# Patient Record
Sex: Female | Born: 1995 | Hispanic: No | Marital: Single | Smoking: Former smoker
Health system: Southern US, Community
[De-identification: ages and names within clinical notes are randomized; demographics above are authoritative.]

---

## 2016-08-05 ENCOUNTER — Emergency Department
Admission: EM | Admit: 2016-08-05 | Discharge: 2016-08-05 | Disposition: A | Discharge status: Home / Self Care | Payer: Self-pay | Attending: Emergency Medicine | Admitting: Emergency Medicine

## 2016-08-05 ENCOUNTER — Encounter: Payer: Self-pay | Admitting: Emergency Medicine

## 2016-08-05 DIAGNOSIS — N939 Abnormal uterine and vaginal bleeding, unspecified: Secondary | ICD-10-CM | POA: Insufficient documentation

## 2016-08-05 DIAGNOSIS — Z5321 Procedure and treatment not carried out due to patient leaving prior to being seen by health care provider: Secondary | ICD-10-CM | POA: Insufficient documentation

## 2016-08-05 LAB — CBC WITH DIFFERENTIAL/PLATELET
Basophils Absolute: 0 10*3/uL (ref 0–0.1)
Basophils Relative: 0 %
Eosinophils Absolute: 0 10*3/uL (ref 0–0.7)
Eosinophils Relative: 0 %
HEMATOCRIT: 35.4 % (ref 35.0–47.0)
HEMOGLOBIN: 11.9 g/dL — AB (ref 12.0–16.0)
Lymphocytes Relative: 34 %
Lymphs Abs: 2.1 10*3/uL (ref 1.0–3.6)
MCH: 27.3 pg (ref 26.0–34.0)
MCHC: 33.5 g/dL (ref 32.0–36.0)
MCV: 81.5 fL (ref 80.0–100.0)
Monocytes Absolute: 0.4 10*3/uL (ref 0.2–0.9)
Monocytes Relative: 6 %
NEUTROS ABS: 3.8 10*3/uL (ref 1.4–6.5)
NEUTROS PCT: 60 %
Platelets: 314 10*3/uL (ref 150–440)
RBC: 4.34 MIL/uL (ref 3.80–5.20)
RDW: 14.5 % (ref 11.5–14.5)
WBC: 6.4 10*3/uL (ref 3.6–11.0)

## 2016-08-05 LAB — BASIC METABOLIC PANEL
Anion gap: 6 (ref 5–15)
BUN: 11 mg/dL (ref 6–20)
CHLORIDE: 107 mmol/L (ref 101–111)
CO2: 25 mmol/L (ref 22–32)
CREATININE: 0.66 mg/dL (ref 0.44–1.00)
Calcium: 9.4 mg/dL (ref 8.9–10.3)
GFR calc non Af Amer: >60 mL/min (ref >60)
Glucose, Bld: 95 mg/dL (ref 65–99)
POTASSIUM: 3.7 mmol/L (ref 3.5–5.1)
SODIUM: 138 mmol/L (ref 135–145)

## 2016-08-05 NOTE — ED Notes (Signed)
Called x 3 in lobby no answer.

## 2016-08-05 NOTE — ED Triage Notes (Signed)
Heavy vaginal bleeding x 1 year.  Also c/o feeling dizzy and faint with every period x 1 year.  Has been seen by OBGYN.

## 2017-12-09 ENCOUNTER — Emergency Department
Admission: EM | Admit: 2017-12-09 | Discharge: 2017-12-09 | Disposition: A | Discharge status: Home / Self Care | Payer: BLUE CROSS/BLUE SHIELD | Attending: Student in an Organized Health Care Education/Training Program | Admitting: Student in an Organized Health Care Education/Training Program

## 2017-12-09 ENCOUNTER — Encounter: Payer: Self-pay | Admitting: Emergency Medicine

## 2017-12-09 ENCOUNTER — Emergency Department: Payer: BLUE CROSS/BLUE SHIELD

## 2017-12-09 ENCOUNTER — Other Ambulatory Visit: Payer: Self-pay

## 2017-12-09 DIAGNOSIS — F430 Acute stress reaction: Secondary | ICD-10-CM | POA: Insufficient documentation

## 2017-12-09 DIAGNOSIS — F410 Panic disorder [episodic paroxysmal anxiety] without agoraphobia: Secondary | ICD-10-CM

## 2017-12-09 DIAGNOSIS — F419 Anxiety disorder, unspecified: Secondary | ICD-10-CM | POA: Diagnosis present

## 2017-12-09 LAB — URINALYSIS, COMPLETE (UACMP) WITH MICROSCOPIC
BILIRUBIN URINE: NEGATIVE
GLUCOSE, UA: NEGATIVE mg/dL
Hgb urine dipstick: NEGATIVE
Ketones, ur: NEGATIVE mg/dL
LEUKOCYTES UA: NEGATIVE
NITRITE: NEGATIVE
Protein, ur: NEGATIVE mg/dL
SPECIFIC GRAVITY, URINE: 1.002 — AB (ref 1.005–1.030)
WBC, UA: NONE SEEN WBC/hpf (ref 0–5)
pH: 7 (ref 5.0–8.0)

## 2017-12-09 LAB — TROPONIN I

## 2017-12-09 LAB — CBC
HCT: 38.9 % (ref 35.0–47.0)
Hemoglobin: 13.1 g/dL (ref 12.0–16.0)
MCH: 28.1 pg (ref 26.0–34.0)
MCHC: 33.5 g/dL (ref 32.0–36.0)
MCV: 83.8 fL (ref 80.0–100.0)
Platelets: 337 10*3/uL (ref 150–440)
RBC: 4.64 MIL/uL (ref 3.80–5.20)
RDW: 14.5 % (ref 11.5–14.5)
WBC: 8.9 10*3/uL (ref 3.6–11.0)

## 2017-12-09 LAB — POCT PREGNANCY, URINE: Preg Test, Ur: NEGATIVE

## 2017-12-09 LAB — FIBRIN DERIVATIVES D-DIMER (ARMC ONLY): FIBRIN DERIVATIVES D-DIMER (ARMC): 218.25 ng{FEU}/mL (ref 0.00–499.00)

## 2017-12-09 MED ORDER — LORAZEPAM 0.5 MG PO TABS: 0.5000 mg | ORAL_TABLET | Freq: Three times a day (TID) | ORAL | 0 refills | Status: AC | PRN

## 2017-12-09 MED ORDER — LORAZEPAM 1 MG PO TABS
1.0000 mg | ORAL_TABLET | Freq: Once | ORAL | Status: AC
  Administered 2017-12-09: 1 mg via ORAL
  Filled 2017-12-09: qty 1

## 2017-12-09 NOTE — ED Provider Notes (Signed)
[facility] Emergency Department Provider Note    First MD Initiated Contact with Patient 12/09/17 1833     (approximate)  I have reviewed the triage vital signs and the nursing notes.   HISTORY  Chief Complaint Dizziness and Chest Pain    HPI Kristy Mendoza is a 22 y.o. female presents the ER with chief complaint of anxiety stress and feeling that she is having a panic attack.  Does not have a formal diagnosis of anxiety but feels like she is been suffering from increased stress and panic for the past several days.  States that she was having chest pain yesterday and shortness of breath.  Has been feeling her heart racing and feels like her throat is closing up.  Does endorse feeling an overwhelming sense of doom.  Denies any substance use.  No SI or HI.  No recent medication changes.    History reviewed. No pertinent past medical history. No family history on file. History reviewed. No pertinent surgical history. There are no active problems to display for this patient.     Prior to Admission medications   Not on File    Allergies Penicillins    Social History Social History   Tobacco Use  . Smoking status: Never Smoker  . Smokeless tobacco: Never Used  Substance Use Topics  . Alcohol use: No  . Drug use: No    Review of Systems Patient denies headaches, rhinorrhea, blurry vision, numbness, shortness of breath, chest pain, edema, cough, abdominal pain, nausea, vomiting, diarrhea, dysuria, fevers, rashes or hallucinations unless otherwise stated above in HPI. ____________________________________________   PHYSICAL EXAM:  VITAL SIGNS: Vitals:   12/09/17 1756  BP: (!) 147/91  Pulse: (!) 120  Resp: 20  Temp: 98.3 F (36.8 C)  SpO2: 100%    Constitutional: Alert and oriented.  Eyes: Conjunctivae are normal.  Head: Atraumatic. Nose: No congestion/rhinnorhea. Mouth/Throat: Mucous membranes are moist.   Neck: No stridor.  Painless ROM.  Cardiovascular: Normal rate, regular rhythm. Grossly normal heart sounds.  Good peripheral circulation. Respiratory: Normal respiratory effort.  No retractions. Lungs CTAB. Gastrointestinal: Soft and nontender. No distention. No abdominal bruits. No CVA tenderness. Genitourinary:  Musculoskeletal: No lower extremity tenderness nor edema.  No joint effusions. Neurologic:  Normal speech and language. No gross focal neurologic deficits are appreciated. No facial droop Skin:  Skin is warm, dry and intact. No rash noted. Psychiatric: Mood and affect are normal. Speech and behavior are normal.  ____________________________________________   LABS (all labs ordered are listed, but only abnormal results are displayed)  Results for orders placed or performed during the hospital encounter of 12/09/17 (from the past 24 hour(s))  CBC     Status: None   Collection Time: 12/09/17  6:01 PM  Result Value Ref Range   WBC 8.9 3.6 - 11.0 K/uL   RBC 4.64 3.80 - 5.20 MIL/uL   Hemoglobin 13.1 12.0 - 16.0 g/dL   HCT 16.1 09.6 - 04.5 %   MCV 83.8 80.0 - 100.0 fL   MCH 28.1 26.0 - 34.0 pg   MCHC 33.5 32.0 - 36.0 g/dL   RDW 40.9 81.1 - 91.4 %   Platelets 337 150 - 440 K/uL  Troponin I     Status: None   Collection Time: 12/09/17  6:01 PM  Result Value Ref Range   Troponin I <0.03 <0.03 ng/mL  Urinalysis, Complete w Microscopic     Status: Abnormal   Collection Time: 12/09/17  6:02  PM  Result Value Ref Range   Color, Urine COLORLESS (A) YELLOW   APPearance CLEAR (A) CLEAR   Specific Gravity, Urine 1.002 (L) 1.005 - 1.030   pH 7.0 5.0 - 8.0   Glucose, UA NEGATIVE NEGATIVE mg/dL   Hgb urine dipstick NEGATIVE NEGATIVE   Bilirubin Urine NEGATIVE NEGATIVE   Ketones, ur NEGATIVE NEGATIVE mg/dL   Protein, ur NEGATIVE NEGATIVE mg/dL   Nitrite NEGATIVE NEGATIVE   Leukocytes, UA NEGATIVE NEGATIVE   RBC / HPF 0-5 0 - 5 RBC/hpf   WBC, UA NONE SEEN 0 - 5 WBC/hpf   Bacteria, UA RARE (A) NONE  SEEN   Squamous Epithelial / LPF 0-5 0 - 5  Pregnancy, urine POC     Status: None   Collection Time: 12/09/17  6:11 PM  Result Value Ref Range   Preg Test, Ur NEGATIVE NEGATIVE   ____________________________________________  EKG My review and personal interpretation at Time: 18:01   Indication: chest pain  Rate: 110  Rhythm: sinus Axis: normal Other: normal intervals, no wpw or brugada ____________________________________________  RADIOLOGY  I personally reviewed all radiographic images ordered to evaluate for the above acute complaints and reviewed radiology reports and findings.  These findings were personally discussed with the patient.  Please see medical record for radiology report.   ____________________________________________   PROCEDURES  Procedure(s) performed:  Procedures    Critical Care performed: no ____________________________________________   INITIAL IMPRESSION / ASSESSMENT AND PLAN / ED COURSE  Pertinent labs & imaging results that were available during my care of the patient were reviewed by me and considered in my medical decision making (see chart for details).   DDX: ACS, pericarditis, esophagitis, boerhaaves, pe, dissection, pna, bronchitis, costochondritis   Kristy Mendoza is a 22 y.o. who presents to the ED with symptoms as described above.  EKG shows no evidence of preexcitation syndrome.  Not clinically consistent with ACS or dissection.  Patient low risk by [employer] but is on birth control therefore d-dimer ordered to further risk stratify, which was negative.  Patient given oral anxiolytic with improvement in symptoms.  Most clinically consistent with anxiety and panic attack.  Patient stable and appropriate for outpatient follow-up.  Discussed signs and symptoms which should return immediately to the hospital.      As part of my medical decision making, I reviewed the following data within the electronic MEDICAL RECORD NUMBER Nursing notes  reviewed and incorporated, Labs reviewed, notes from prior ED visits.   ____________________________________________   FINAL CLINICAL IMPRESSION(S) / ED DIAGNOSES  Final diagnoses:  Panic attack as reaction to stress      NEW MEDICATIONS STARTED DURING THIS VISIT:  New Prescriptions   No medications on file     Note:  This document was prepared using Dragon voice recognition software and may include unintentional dictation errors.    Willy Eddyobinson, Anik Wesch, MD 12/09/17 2107

## 2017-12-09 NOTE — ED Triage Notes (Signed)
States chest pain, dizziness, nausea since yesterday. Has history of panic attacks so thought it was that but has not resolved. Nauseated without emesis.

## 2018-01-23 ENCOUNTER — Ambulatory Visit: Payer: Self-pay | Admitting: Physician Assistant

## 2018-03-13 ENCOUNTER — Emergency Department: Payer: BLUE CROSS/BLUE SHIELD

## 2018-03-13 ENCOUNTER — Other Ambulatory Visit: Payer: Self-pay

## 2018-03-13 ENCOUNTER — Emergency Department
Admission: EM | Admit: 2018-03-13 | Discharge: 2018-03-13 | Disposition: A | Discharge status: Home / Self Care | Payer: BLUE CROSS/BLUE SHIELD | Attending: Emergency Medicine | Admitting: Emergency Medicine

## 2018-03-13 DIAGNOSIS — R0789 Other chest pain: Secondary | ICD-10-CM | POA: Insufficient documentation

## 2018-03-13 DIAGNOSIS — K2900 Acute gastritis without bleeding: Secondary | ICD-10-CM | POA: Insufficient documentation

## 2018-03-13 DIAGNOSIS — R079 Chest pain, unspecified: Secondary | ICD-10-CM | POA: Diagnosis present

## 2018-03-13 LAB — TROPONIN I: Troponin I: <0.03 ng/mL (ref <0.03)

## 2018-03-13 LAB — CBC
HEMATOCRIT: 37.9 % (ref 35.0–47.0)
Hemoglobin: 12.7 g/dL (ref 12.0–16.0)
MCH: 27.5 pg (ref 26.0–34.0)
MCHC: 33.5 g/dL (ref 32.0–36.0)
MCV: 82 fL (ref 80.0–100.0)
PLATELETS: 333 10*3/uL (ref 150–440)
RBC: 4.62 MIL/uL (ref 3.80–5.20)
RDW: 15.7 % — ABNORMAL HIGH (ref 11.5–14.5)
WBC: 8.9 10*3/uL (ref 3.6–11.0)

## 2018-03-13 LAB — BASIC METABOLIC PANEL
Anion gap: 11 (ref 5–15)
BUN: 11 mg/dL (ref 6–20)
CHLORIDE: 105 mmol/L (ref 98–111)
CO2: 24 mmol/L (ref 22–32)
CREATININE: 0.74 mg/dL (ref 0.44–1.00)
Calcium: 9.7 mg/dL (ref 8.9–10.3)
GFR calc Af Amer: >60 mL/min (ref >60)
GFR calc non Af Amer: >60 mL/min (ref >60)
Glucose, Bld: 98 mg/dL (ref 70–99)
POTASSIUM: 3.8 mmol/L (ref 3.5–5.1)
Sodium: 140 mmol/L (ref 135–145)

## 2018-03-13 LAB — POCT PREGNANCY, URINE: Preg Test, Ur: NEGATIVE

## 2018-03-13 MED ORDER — SUCRALFATE 1 G PO TABS: 1.0000 g | ORAL_TABLET | Freq: Four times a day (QID) | ORAL | 1 refills | Status: AC

## 2018-03-13 MED ORDER — FAMOTIDINE 20 MG PO TABS: 20.0000 mg | ORAL_TABLET | Freq: Two times a day (BID) | ORAL | 0 refills | Status: AC

## 2018-03-13 NOTE — ED Notes (Signed)
Pt c/o intermittent substernal chest tightness that last approximately over the past week. Denies N/V.

## 2018-03-13 NOTE — ED Provider Notes (Signed)
[facility] Emergency Department Provider Note  ____________________________________________  Time seen: Approximately 7:22 PM  I have reviewed the triage vital signs and the nursing notes.   HISTORY  Chief Complaint Chest Pain    HPI Kristy Mendoza is a 22 y.o. female with no significant past medical history who complains of chest pain for the past week.  Contrary to what is reported in the triage note, she describes this pain as dull, central chest, nonradiating, intermittent lasting 15 minutes at a time, not exertional, not pleuritic.  No  alleviating factors.  It does seem to be worse at night and after eating.  No trauma fevers chills or cough.  No recent travel trauma hospitalization or surgery.  Non-smoker, no recent exogenous hormone use.  No history of DVT or PE.   History reviewed. No pertinent past medical history.   There are no active problems to display for this patient.    History reviewed. No pertinent surgical history.   Prior to Admission medications   Medication Sig Start Date End Date Taking? Authorizing Provider  famotidine (PEPCID) 20 MG tablet Take 1 tablet (20 mg total) by mouth 2 (two) times daily. 03/13/18   Sharman Cheek, MD  LORazepam (ATIVAN) 0.5 MG tablet Take 1 tablet (0.5 mg total) by mouth every 8 (eight) hours as needed for anxiety. 12/09/17 12/09/18  Willy Eddy, MD  sucralfate (CARAFATE) 1 g tablet Take 1 tablet (1 g total) by mouth 4 (four) times daily. 03/13/18   Sharman Cheek, MD     Allergies Penicillins   No family history on file.  Social History Social History   Tobacco Use  . Smoking status: Never Smoker  . Smokeless tobacco: Never Used  Substance Use Topics  . Alcohol use: No  . Drug use: No    Review of Systems  Constitutional:   No fever or chills.  ENT:   No sore throat. No rhinorrhea. Cardiovascular:   Positive as above chest pain without syncope. Respiratory:   No dyspnea  or cough. Gastrointestinal:   Negative for abdominal pain, vomiting and diarrhea.  Musculoskeletal:   Negative for focal pain or swelling All other systems reviewed and are negative except as documented above in ROS and HPI.  ____________________________________________   PHYSICAL EXAM:  VITAL SIGNS: ED Triage Vitals [03/13/18 1639]  Enc Vitals Group     BP (!) 156/97     Pulse Rate (!) 109     Resp 18     Temp 98.6 F (37 C)     Temp Source Oral     SpO2 98 %     Weight 176 lb (79.8 kg)     Height 5\' 4"  (1.626 m)     Head Circumference      Peak Flow      Pain Score 0     Pain Loc      Pain Edu?      Excl. in GC?     Vital signs reviewed, nursing assessments reviewed.   Constitutional:   Alert and oriented. Non-toxic appearance. Eyes:   Conjunctivae are normal. EOMI. PERRL. ENT      Head:   Normocephalic and atraumatic.      Nose:   No congestion/rhinnorhea.       Mouth/Throat:   MMM, no pharyngeal erythema. No peritonsillar mass.       Neck:   No meningismus. Full ROM. Hematological/Lymphatic/Immunilogical:   No cervical lymphadenopathy. Cardiovascular:   Tachycardia heart rate 100. Symmetric  bilateral radial and DP pulses.  No murmurs. Cap refill less than 2 seconds. Respiratory:   Normal respiratory effort without tachypnea/retractions. Breath sounds are clear and equal bilaterally. No wheezes/rales/rhonchi. Gastrointestinal:   Soft with mild left upper quadrant tenderness. Non distended. There is no CVA tenderness.  No rebound, rigidity, or guarding. Musculoskeletal:   Normal range of motion in all extremities. No joint effusions.  No lower extremity tenderness.  No edema. Neurologic:   Normal speech and language.  Motor grossly intact. No acute focal neurologic deficits are appreciated.  Skin:    Skin is warm, dry and intact. No rash noted.  No petechiae, purpura, or bullae.  ____________________________________________    LABS (pertinent  positives/negatives) (all labs ordered are listed, but only abnormal results are displayed) Labs Reviewed  CBC - Abnormal; Notable for the following components:      Result Value   RDW 15.7 (*)    All other components within normal limits  BASIC METABOLIC PANEL  TROPONIN I  POC URINE PREG, ED  POCT PREGNANCY, URINE   ____________________________________________   EKG  Interpreted by me Sinus tachycardia rate 112, normal axis and intervals.  Normal QRS ST segments and T waves.  No right heart strain.  1 PVC on the strip.  ____________________________________________    RADIOLOGY  Dg Chest 2 View  Result Date: 03/13/2018 CLINICAL DATA:  Chest pain EXAM: CHEST - 2 VIEW COMPARISON:  12/09/2017 FINDINGS: The heart size and mediastinal contours are within normal limits. Both lungs are clear. Bilateral nipple rings. IMPRESSION: No active cardiopulmonary disease. Electronically Signed   By: Jasmine Pang M.D.   On: 03/13/2018 17:09    ____________________________________________   PROCEDURES Procedures  ____________________________________________  DIFFERENTIAL DIAGNOSIS   GERD/gastritis, anxiety.  CLINICAL IMPRESSION / ASSESSMENT AND PLAN / ED COURSE  Pertinent labs & imaging results that were available during my care of the patient were reviewed by me and considered in my medical decision making (see chart for details).    Patient presents with atypical chest pain.  Symptoms and exam are very much consistent with gastritis.    Considering the patient's symptoms, medical history, and physical examination today, I have low suspicion for ACS, PE, TAD, pneumothorax, carditis, mediastinitis, pneumonia, CHF, or sepsis.  Trial of an acids for the next week, follow-up with primary care.  Return precautions for any worsening symptoms including constant chest pain or shortness of breath or pleuritic symptoms  Tachycardia is attributable to anxiousness which the patient does report  related to being in the ED after being told at the urgent care she might have a blood clot in her lungs.  I think the chance of this being a PE is extremely low given the atypical symptoms, lack of pleuritic nature, lack of shortness of breath, lack of risk factors, otherwise normal vital signs.      ____________________________________________   FINAL CLINICAL IMPRESSION(S) / ED DIAGNOSES    Final diagnoses:  Atypical chest pain  Acute gastritis without hemorrhage, unspecified gastritis type     ED Discharge Orders         Ordered    sucralfate (CARAFATE) 1 g tablet  4 times daily     03/13/18 1921    famotidine (PEPCID) 20 MG tablet  2 times daily     03/13/18 1921          Portions of this note were generated with dragon dictation software. Dictation errors may occur despite best attempts at proofreading.  Sharman CheekStafford, Tiras Bianchini, MD 03/13/18 [phone]

## 2018-03-13 NOTE — ED Notes (Signed)
Urine sent to the lab in case needed.

## 2018-03-13 NOTE — ED Triage Notes (Signed)
Chest pain intermittent with deep breathing for last 3 months. Pt went to urgent care and sent to Er for further evaluation. Pt alert and oriented X4, active, cooperative, pt in NAD. RR even and unlabored, color WNL.

## 2018-03-27 ENCOUNTER — Encounter: Payer: Self-pay | Admitting: Emergency Medicine

## 2018-03-27 ENCOUNTER — Emergency Department: Payer: BLUE CROSS/BLUE SHIELD

## 2018-03-27 ENCOUNTER — Emergency Department
Admission: EM | Admit: 2018-03-27 | Discharge: 2018-03-28 | Disposition: A | Discharge status: Home / Self Care | Payer: BLUE CROSS/BLUE SHIELD | Attending: Emergency Medicine | Admitting: Emergency Medicine

## 2018-03-27 ENCOUNTER — Other Ambulatory Visit: Payer: Self-pay

## 2018-03-27 DIAGNOSIS — Z79899 Other long term (current) drug therapy: Secondary | ICD-10-CM | POA: Insufficient documentation

## 2018-03-27 DIAGNOSIS — R0789 Other chest pain: Secondary | ICD-10-CM | POA: Insufficient documentation

## 2018-03-27 DIAGNOSIS — Z87891 Personal history of nicotine dependence: Secondary | ICD-10-CM | POA: Diagnosis not present

## 2018-03-27 DIAGNOSIS — R1011 Right upper quadrant pain: Secondary | ICD-10-CM | POA: Insufficient documentation

## 2018-03-27 DIAGNOSIS — R079 Chest pain, unspecified: Secondary | ICD-10-CM

## 2018-03-27 LAB — CBC WITH DIFFERENTIAL/PLATELET
BASOS PCT: 1 %
Basophils Absolute: 0 10*3/uL (ref 0–0.1)
Eosinophils Absolute: 0 10*3/uL (ref 0–0.7)
Eosinophils Relative: 1 %
HEMATOCRIT: 36.7 % (ref 35.0–47.0)
HEMOGLOBIN: 12.4 g/dL (ref 12.0–16.0)
LYMPHS ABS: 3.1 10*3/uL (ref 1.0–3.6)
LYMPHS PCT: 33 %
MCH: 27.8 pg (ref 26.0–34.0)
MCHC: 33.9 g/dL (ref 32.0–36.0)
MCV: 81.9 fL (ref 80.0–100.0)
MONOS PCT: 8 %
Monocytes Absolute: 0.7 10*3/uL (ref 0.2–0.9)
NEUTROS ABS: 5.4 10*3/uL (ref 1.4–6.5)
NEUTROS PCT: 57 %
Platelets: 294 10*3/uL (ref 150–440)
RBC: 4.48 MIL/uL (ref 3.80–5.20)
RDW: 15.5 % — AB (ref 11.5–14.5)
WBC: 9.3 10*3/uL (ref 3.6–11.0)

## 2018-03-27 LAB — COMPREHENSIVE METABOLIC PANEL
ALBUMIN: 4.4 g/dL (ref 3.5–5.0)
ALK PHOS: 61 U/L (ref 38–126)
ALT: 18 U/L (ref 0–44)
AST: 17 U/L (ref 15–41)
Anion gap: 9 (ref 5–15)
BILIRUBIN TOTAL: 0.6 mg/dL (ref 0.3–1.2)
BUN: 10 mg/dL (ref 6–20)
CALCIUM: 9.2 mg/dL (ref 8.9–10.3)
CO2: 23 mmol/L (ref 22–32)
Chloride: 106 mmol/L (ref 98–111)
Creatinine, Ser: 0.69 mg/dL (ref 0.44–1.00)
GFR calc Af Amer: >60 mL/min (ref >60)
GLUCOSE: 101 mg/dL — AB (ref 70–99)
POTASSIUM: 3.5 mmol/L (ref 3.5–5.1)
Sodium: 138 mmol/L (ref 135–145)
TOTAL PROTEIN: 7.6 g/dL (ref 6.5–8.1)

## 2018-03-27 LAB — TROPONIN I

## 2018-03-27 NOTE — ED Triage Notes (Signed)
Patient ambulatory to triage with steady gait, without difficulty or distress noted; st here 2wks ago and rx med for reflux but symptoms persist of "burning" sternal & upper CP accomp by nausea; st unable to sleep

## 2018-03-28 ENCOUNTER — Emergency Department: Payer: BLUE CROSS/BLUE SHIELD

## 2018-03-28 LAB — FIBRIN DERIVATIVES D-DIMER (ARMC ONLY): FIBRIN DERIVATIVES D-DIMER (ARMC): 222.37 ng{FEU}/mL (ref 0.00–499.00)

## 2018-03-28 NOTE — ED Provider Notes (Signed)
[facility] Emergency Department Provider Note    First MD Initiated Contact with Patient 03/27/18 2353     (approximate)  I have reviewed the triage vital signs and the nursing notes.   HISTORY  Chief Complaint Chest Pain    HPI Kristy Mendoza is a 22 y.o. female presents the emergency department with a two-week history of burning upper abdominal discomfort that radiates to the midline of the chest. Patient denies any dyspnea no acute pain or swelling. No personal family history of DVT or PE. Patient was seen in the emergency department 2 weeks ago for the same and prescribed Carafate and Pepcid however patient states that the medication has not improved symptoms at all.   past medical history Anxiety disorder There are no active problems to display for this patient.   past surgical history None  Prior to Admission medications   Medication Sig Start Date End Date Taking? Authorizing Provider  famotidine (PEPCID) 20 MG tablet Take 1 tablet (20 mg total) by mouth 2 (two) times daily. 03/13/18   Sharman Cheek, MD  LORazepam (ATIVAN) 0.5 MG tablet Take 1 tablet (0.5 mg total) by mouth every 8 (eight) hours as needed for anxiety. 12/09/17 12/09/18  Willy Eddy, MD  sucralfate (CARAFATE) 1 g tablet Take 1 tablet (1 g total) by mouth 4 (four) times daily. 03/13/18   Sharman Cheek, MD    Allergies Penicillins  No family history on file.  Social History Social History   Tobacco Use  . Smoking status: Former Games developer  . Smokeless tobacco: Never Used  Substance Use Topics  . Alcohol use: No  . Drug use: No    Review of Systems Constitutional: No fever/chills Eyes: No visual changes. ENT: No sore throat. Cardiovascular: Denies chest pain. Respiratory: Denies shortness of breath. Gastrointestinal: positive for "burning" abdominal pain.  No nausea, no vomiting.  No diarrhea.  No constipation. Genitourinary: Negative for  dysuria. Musculoskeletal: Negative for neck pain.  Negative for back pain. Integumentary: Negative for rash. Neurological: Negative for headaches, focal weakness or numbness.  ____________________________________________   PHYSICAL EXAM:  VITAL SIGNS: ED Triage Vitals  Enc Vitals Group     BP 03/27/18 2018 137/75     Pulse Rate 03/27/18 2018 (!) 108     Resp 03/27/18 2018 18     Temp 03/27/18 2018 99.7 F (37.6 C)     Temp Source 03/27/18 2018 Oral     SpO2 03/27/18 2018 100 %     Weight 03/27/18 2019 78.9 kg (174 lb)     Height 03/27/18 2019 1.626 m (5\' 4" )     Head Circumference --      Peak Flow --      Pain Score 03/27/18 2024 4     Pain Loc --      Pain Edu? --      Excl. in GC? --     Constitutional: Alert and oriented. Well appearing and in no acute distress. Eyes: Conjunctivae are normal. PERRL. EOMI. Nose: No congestion/rhinnorhea. Mouth/Throat: Mucous membranes are moist. Oropharynx non-erythematous. Neck: No stridor.  Cardiovascular: Normal rate, regular rhythm. Good peripheral circulation. Grossly normal heart sounds. Respiratory: Normal respiratory effort.  No retractions. Lungs CTAB. Gastrointestinal: right upper quadrant tenderness to palpation.. No distention.  Musculoskeletal: No lower extremity tenderness nor edema. No gross deformities of extremities. Neurologic:  Normal speech and language. No gross focal neurologic deficits are appreciated.  Skin:  Skin is warm, dry and intact. No rash noted.  Psychiatric: Mood and affect are normal. Speech and behavior are normal.  ____________________________________________   LABS (all labs ordered are listed, but only abnormal results are displayed)  Labs Reviewed  CBC WITH DIFFERENTIAL/PLATELET - Abnormal; Notable for the following components:      Result Value   RDW 15.5 (*)    All other components within normal limits  COMPREHENSIVE METABOLIC PANEL - Abnormal; Notable for the following components:    Glucose, Bld 101 (*)    All other components within normal limits  TROPONIN I   ____________________________________________  EKG  ED ECG REPORT I, [place] N [place] Zucco, the attending physician, personally viewed and interpreted this ECG.   Date: 03/28/2018  EKG Time: 8:18 PM  Rate: 130  Rhythm: sinus tachycardia  Axis: normal  Intervals:normal  ST&T Change: none  ____________________________________________  RADIOLOGY I, [place] N Anjana Cheek, personally viewed and evaluated these images (plain radiographs) as part of my medical decision making, as well as reviewing the written report by the radiologist.  ED MD interpretation:  no active cardiopulmonary disease per radiologist.  Official radiology report(s): Dg Chest 2 View  Result Date: 03/27/2018 CLINICAL DATA:  Reflux, sternal and upper chest pain with nausea EXAM: CHEST - 2 VIEW COMPARISON:  03/13/2018 FINDINGS: The heart size and mediastinal contours are within normal limits. Both lungs are clear. The visualized skeletal structures are unremarkable. Normal heart size and vascularity. Trachea is midline. Nipple piercings noted. Normal bowel gas pattern. No significant osseous finding. IMPRESSION: No active cardiopulmonary disease. Electronically Signed   By: Judie Petit.  Shick M.D.   On: 03/27/2018 20:46    _________________________ Procedures   ____________________________________________   INITIAL IMPRESSION / ASSESSMENT AND PLAN / ED COURSE  As part of my medical decision making, I reviewed the following data within the electronic MEDICAL RECORD NUMBER   22 year old female presented with above-stated history and physical exam secondary to burning chest discomfort extending from the epigastrium.  Ultrasound performed revealed no evidence of cholelithiasis or cholecystitis.  Patient states that long-standing history of anxiety and questions if this may be secondary to the anxiety.  Patient pain-free when I evaluated her and remained  pain-free during the entire emergency department stay.  Patient will be referred to gastroenterology for further outpatient evaluation and possible endoscopy. ____________________________________________  FINAL CLINICAL IMPRESSION(S) / ED DIAGNOSES  Final diagnoses:  RUQ pain  Chest pain, unspecified type     MEDICATIONS GIVEN DURING THIS VISIT:  Medications - No data to display   ED Discharge Orders    None       Note:  This document was prepared using Dragon voice recognition software and may include unintentional dictation errors.    Darci Current, MD 03/28/18 [phone]

## 2018-03-28 NOTE — ED Notes (Signed)
Pt resting on stretcher awaiting ultrasound. No distress noted. Updated on plan of care. Denies pain. Asking when she will be going home. Provided for comfort and safety and will continue to assess.

## 2018-03-28 NOTE — ED Notes (Signed)
Dr brown at the bedside to discuss [place] results and discharge.

## 2019-07-09 IMAGING — US US ABDOMEN LIMITED
1 series · 14 of 25 positions shown · non-contrast
Comparison: None.

CLINICAL DATA: Right upper quadrant pain x2 weeks

EXAM:
ULTRASOUND ABDOMEN LIMITED RIGHT UPPER QUADRANT

[Series 1: us abdomen limited · 0.16mm/px · 14 of 37 slices shown]
[im 1/37]
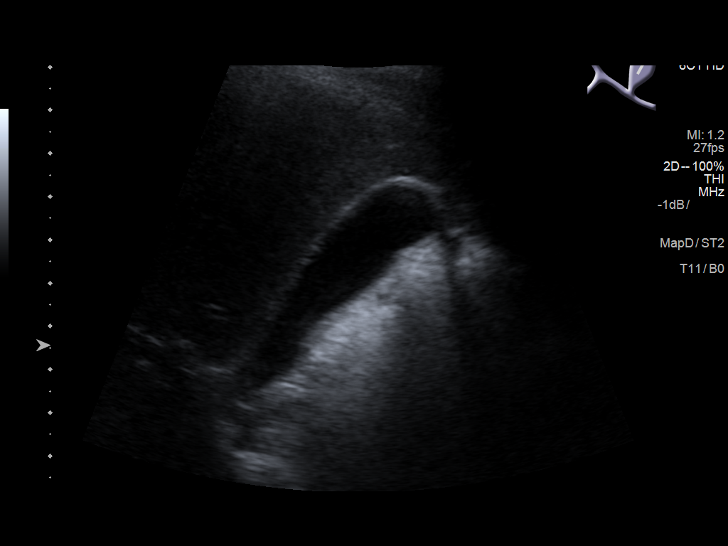
[im 4/37]
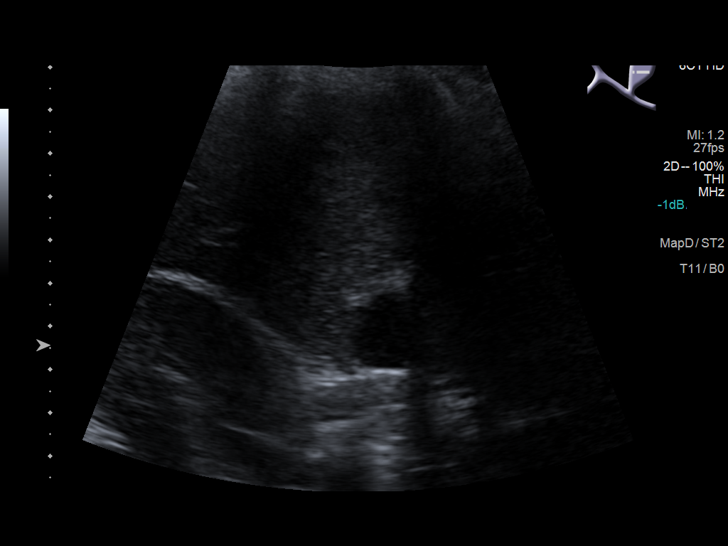
[im 7/37]
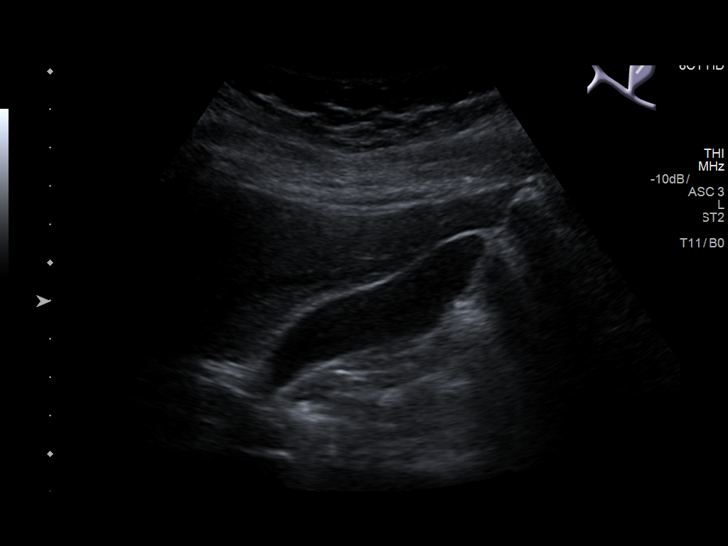
[im 10/37]
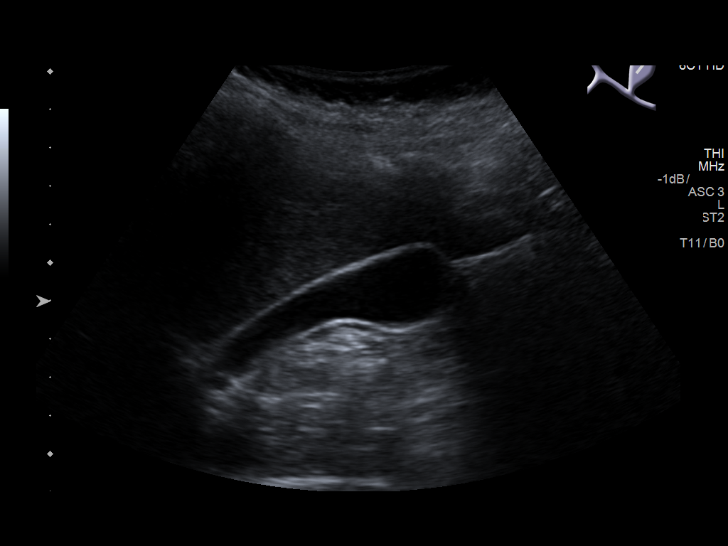
[im 13/37]
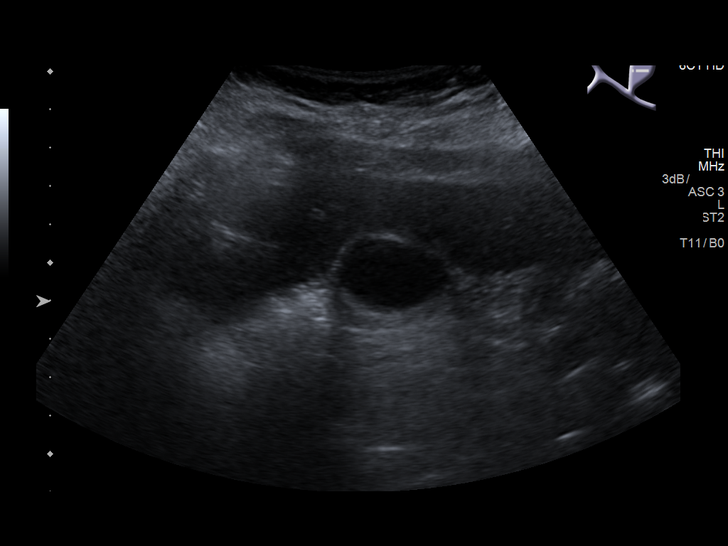
[im 14/37]
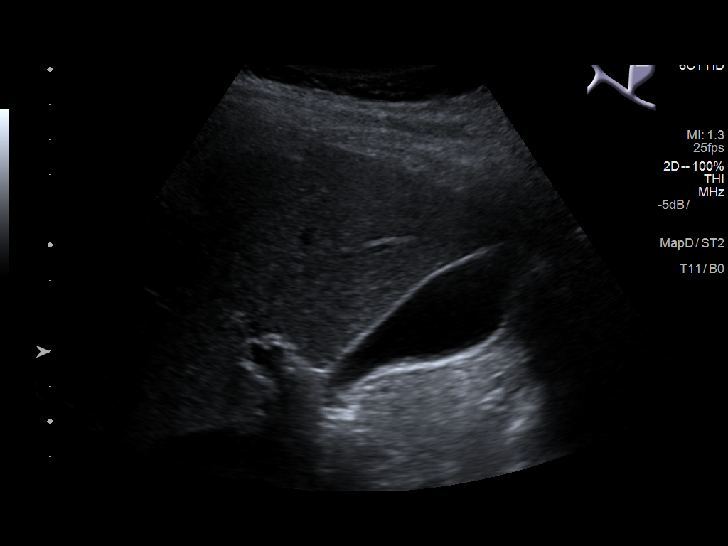
[im 17/37]
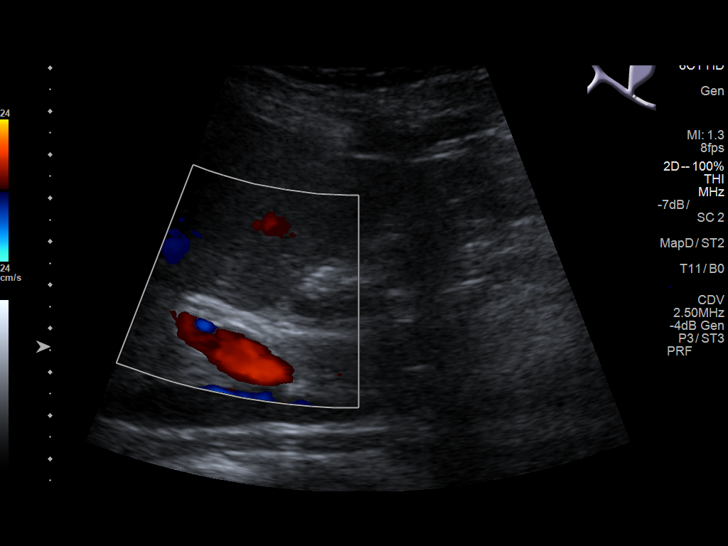
[im 20/37]
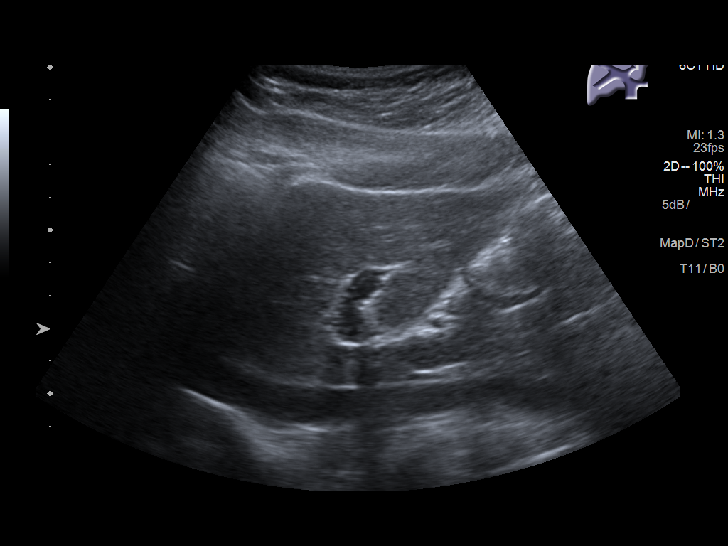
[im 23/37]
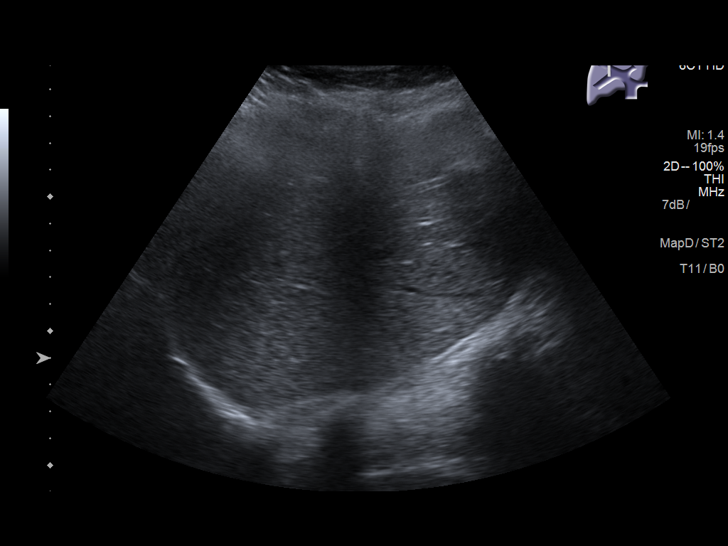
[im 25/37]
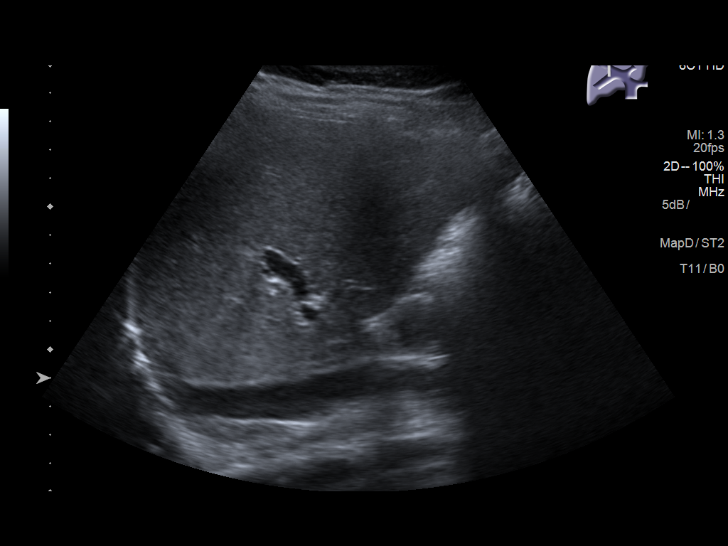
[im 28/37]
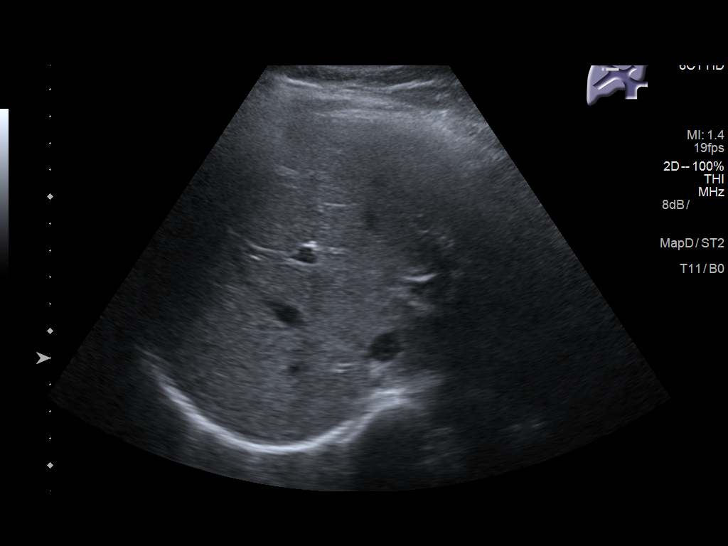
[im 31/37]
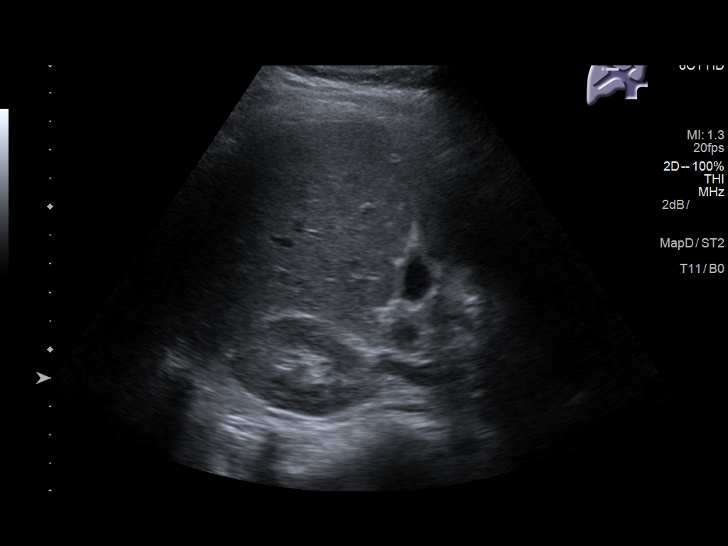
[im 34/37]
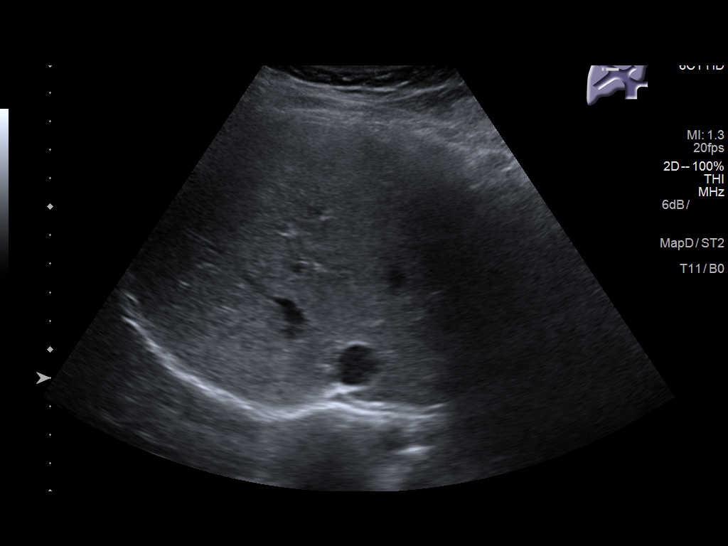
[im 37/37]
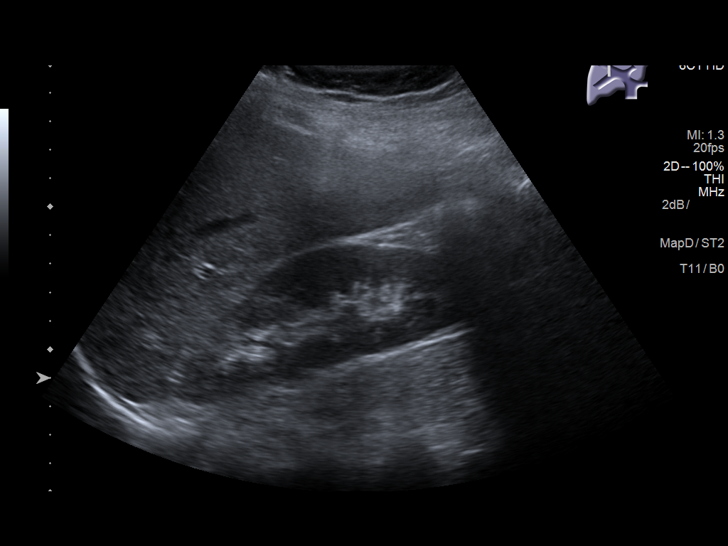

[14 of 25 positions shown; findings below may reference images not displayed]

FINDINGS: Gallbladder:

No gallstones, gallbladder wall thickening, or pericholecystic
fluid. Negative sonographic Murphy's sign.

Common bile duct:

Diameter: 2 mm

Liver:

No focal lesion identified. Within normal limits in parenchymal
echogenicity. Portal vein is patent on color Doppler imaging with
normal direction of blood flow towards the liver.
IMPRESSION: Negative right upper quadrant ultrasound.

## 2019-11-23 IMAGING — CR DG CHEST 2V
2 series · 2 of 2 positions shown · non-contrast
Comparison: 12/09/2017

CLINICAL DATA: Chest pain

EXAM:
CHEST - 2 VIEW

[chest pa]
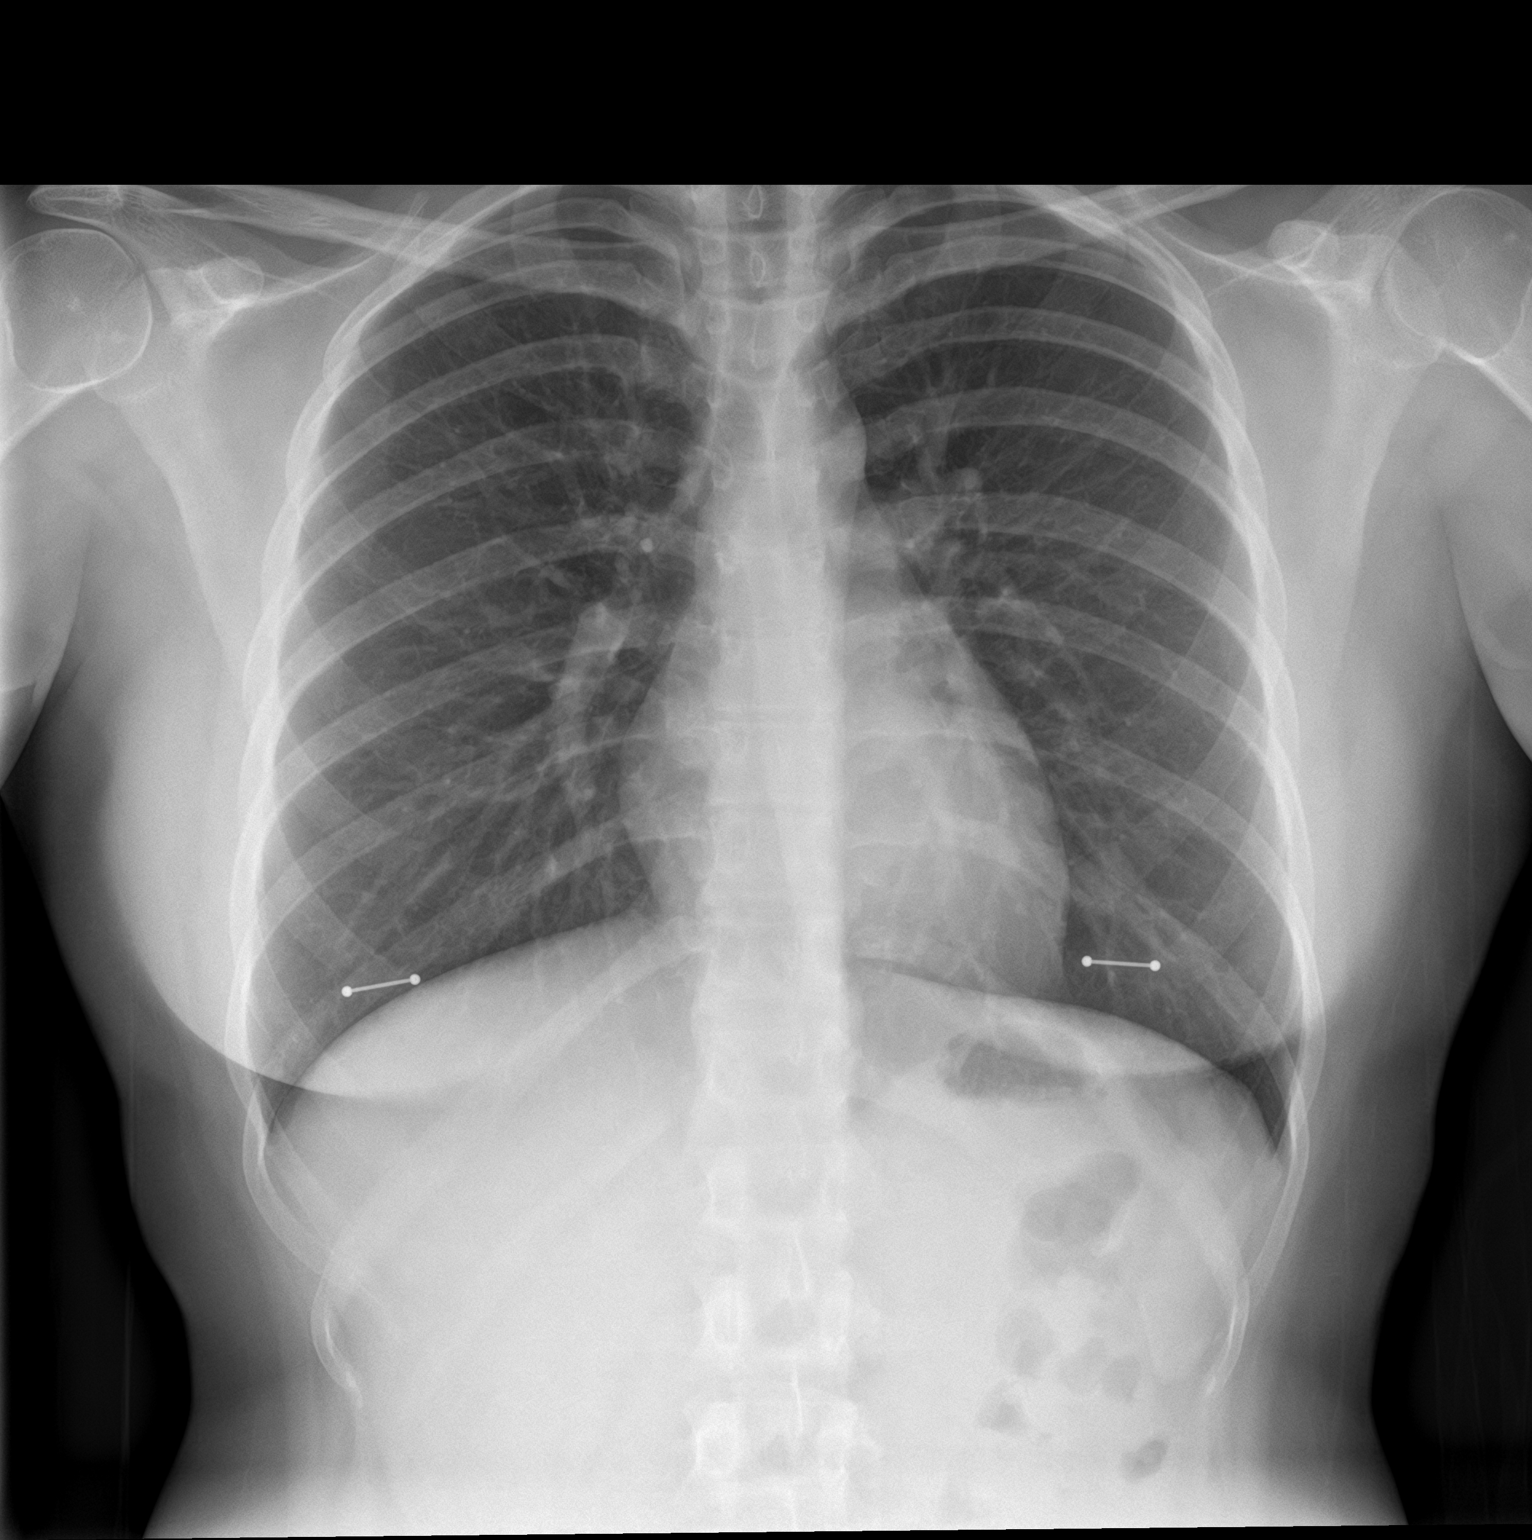

[chest lat]
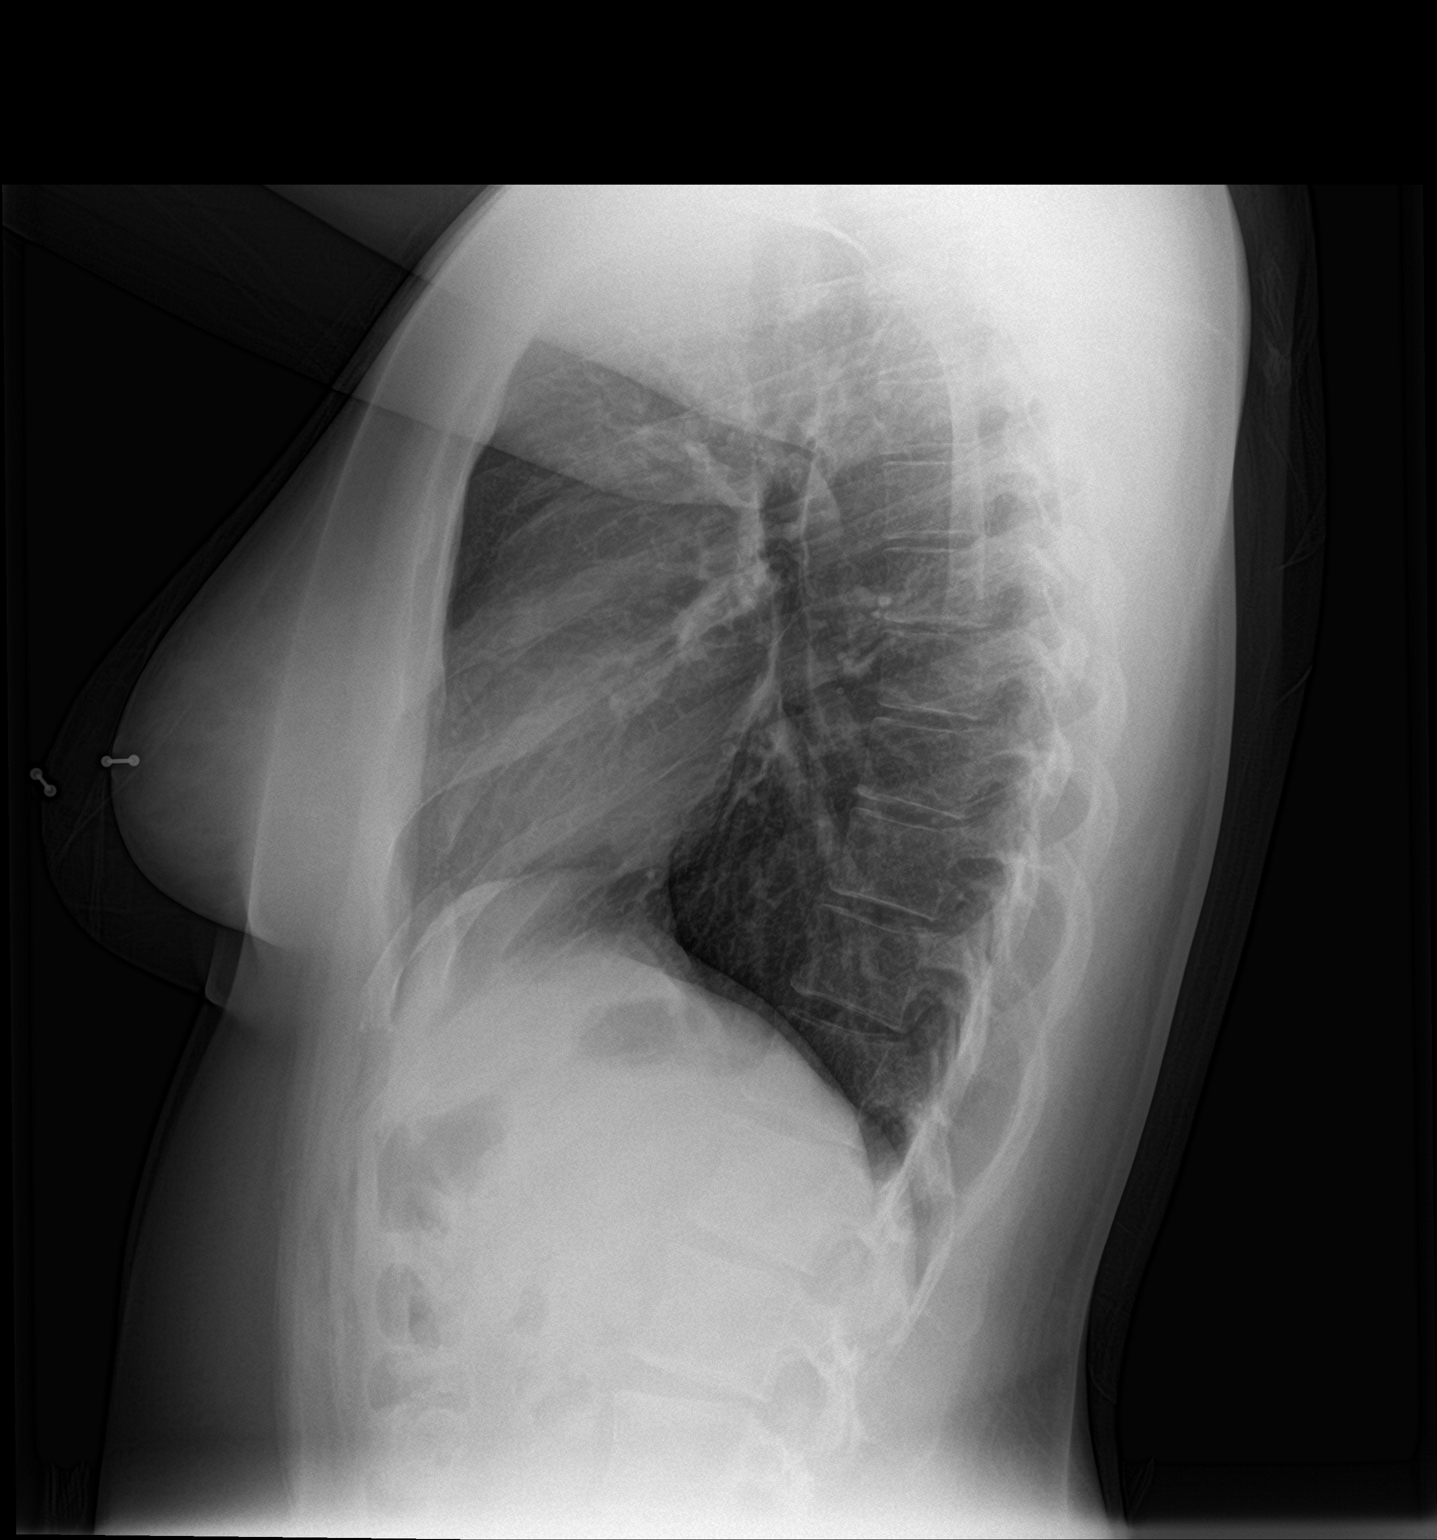

[2 of 2 positions shown; findings below may reference images not displayed]

FINDINGS: The heart size and mediastinal contours are within normal limits.
Both lungs are clear. Bilateral nipple rings.
IMPRESSION: No active cardiopulmonary disease.

## 2022-09-05 DIAGNOSIS — F4323 Adjustment disorder with mixed anxiety and depressed mood: Secondary | ICD-10-CM | POA: Diagnosis not present

## 2022-10-05 DIAGNOSIS — F319 Bipolar disorder, unspecified: Secondary | ICD-10-CM | POA: Diagnosis not present

## 2022-10-05 DIAGNOSIS — F4323 Adjustment disorder with mixed anxiety and depressed mood: Secondary | ICD-10-CM | POA: Diagnosis not present

## 2022-10-10 DIAGNOSIS — F319 Bipolar disorder, unspecified: Secondary | ICD-10-CM | POA: Diagnosis not present

## 2022-10-10 DIAGNOSIS — F4323 Adjustment disorder with mixed anxiety and depressed mood: Secondary | ICD-10-CM | POA: Diagnosis not present

## 2022-11-07 DIAGNOSIS — F3162 Bipolar disorder, current episode mixed, moderate: Secondary | ICD-10-CM | POA: Diagnosis not present

## 2022-11-07 DIAGNOSIS — F411 Generalized anxiety disorder: Secondary | ICD-10-CM | POA: Diagnosis not present

## 2022-11-07 DIAGNOSIS — F902 Attention-deficit hyperactivity disorder, combined type: Secondary | ICD-10-CM | POA: Diagnosis not present

## 2022-11-11 DIAGNOSIS — F3181 Bipolar II disorder: Secondary | ICD-10-CM | POA: Diagnosis not present

## 2022-11-14 DIAGNOSIS — F3181 Bipolar II disorder: Secondary | ICD-10-CM | POA: Diagnosis not present

## 2022-12-02 DIAGNOSIS — F3181 Bipolar II disorder: Secondary | ICD-10-CM | POA: Diagnosis not present

## 2022-12-06 DIAGNOSIS — F411 Generalized anxiety disorder: Secondary | ICD-10-CM | POA: Diagnosis not present

## 2022-12-06 DIAGNOSIS — F3162 Bipolar disorder, current episode mixed, moderate: Secondary | ICD-10-CM | POA: Diagnosis not present

## 2022-12-06 DIAGNOSIS — F902 Attention-deficit hyperactivity disorder, combined type: Secondary | ICD-10-CM | POA: Diagnosis not present

## 2022-12-09 DIAGNOSIS — F314 Bipolar disorder, current episode depressed, severe, without psychotic features: Secondary | ICD-10-CM | POA: Diagnosis not present

## 2022-12-09 DIAGNOSIS — F411 Generalized anxiety disorder: Secondary | ICD-10-CM | POA: Diagnosis not present

## 2022-12-12 DIAGNOSIS — E282 Polycystic ovarian syndrome: Secondary | ICD-10-CM | POA: Diagnosis not present

## 2022-12-20 DIAGNOSIS — F3162 Bipolar disorder, current episode mixed, moderate: Secondary | ICD-10-CM | POA: Diagnosis not present

## 2022-12-20 DIAGNOSIS — F902 Attention-deficit hyperactivity disorder, combined type: Secondary | ICD-10-CM | POA: Diagnosis not present

## 2022-12-20 DIAGNOSIS — F411 Generalized anxiety disorder: Secondary | ICD-10-CM | POA: Diagnosis not present

## 2023-01-05 DIAGNOSIS — F3162 Bipolar disorder, current episode mixed, moderate: Secondary | ICD-10-CM | POA: Diagnosis not present

## 2023-01-05 DIAGNOSIS — F902 Attention-deficit hyperactivity disorder, combined type: Secondary | ICD-10-CM | POA: Diagnosis not present

## 2023-01-05 DIAGNOSIS — F411 Generalized anxiety disorder: Secondary | ICD-10-CM | POA: Diagnosis not present

## 2023-01-12 DIAGNOSIS — F331 Major depressive disorder, recurrent, moderate: Secondary | ICD-10-CM | POA: Diagnosis not present

## 2023-01-30 DIAGNOSIS — F411 Generalized anxiety disorder: Secondary | ICD-10-CM | POA: Diagnosis not present

## 2023-01-30 DIAGNOSIS — F902 Attention-deficit hyperactivity disorder, combined type: Secondary | ICD-10-CM | POA: Diagnosis not present

## 2023-01-30 DIAGNOSIS — F3162 Bipolar disorder, current episode mixed, moderate: Secondary | ICD-10-CM | POA: Diagnosis not present

## 2023-01-31 DIAGNOSIS — Z01419 Encounter for gynecological examination (general) (routine) without abnormal findings: Secondary | ICD-10-CM | POA: Diagnosis not present

## 2023-01-31 DIAGNOSIS — E669 Obesity, unspecified: Secondary | ICD-10-CM | POA: Diagnosis not present

## 2023-01-31 DIAGNOSIS — Z713 Dietary counseling and surveillance: Secondary | ICD-10-CM | POA: Diagnosis not present

## 2023-01-31 DIAGNOSIS — Z0000 Encounter for general adult medical examination without abnormal findings: Secondary | ICD-10-CM | POA: Diagnosis not present

## 2023-01-31 DIAGNOSIS — Z7182 Exercise counseling: Secondary | ICD-10-CM | POA: Diagnosis not present

## 2023-01-31 DIAGNOSIS — E663 Overweight: Secondary | ICD-10-CM | POA: Diagnosis not present

## 2023-02-06 DIAGNOSIS — F331 Major depressive disorder, recurrent, moderate: Secondary | ICD-10-CM | POA: Diagnosis not present

## 2023-02-14 DIAGNOSIS — F3181 Bipolar II disorder: Secondary | ICD-10-CM | POA: Diagnosis not present

## 2023-03-06 DIAGNOSIS — F3181 Bipolar II disorder: Secondary | ICD-10-CM | POA: Diagnosis not present

## 2023-03-06 DIAGNOSIS — R0683 Snoring: Secondary | ICD-10-CM | POA: Diagnosis not present

## 2023-03-06 DIAGNOSIS — R5383 Other fatigue: Secondary | ICD-10-CM | POA: Diagnosis not present

## 2023-03-10 DIAGNOSIS — F3181 Bipolar II disorder: Secondary | ICD-10-CM | POA: Diagnosis not present

## 2023-03-13 DIAGNOSIS — F902 Attention-deficit hyperactivity disorder, combined type: Secondary | ICD-10-CM | POA: Diagnosis not present

## 2023-03-13 DIAGNOSIS — F3162 Bipolar disorder, current episode mixed, moderate: Secondary | ICD-10-CM | POA: Diagnosis not present

## 2023-03-13 DIAGNOSIS — F411 Generalized anxiety disorder: Secondary | ICD-10-CM | POA: Diagnosis not present

## 2023-04-14 DIAGNOSIS — F3181 Bipolar II disorder: Secondary | ICD-10-CM | POA: Diagnosis not present

## 2024-01-29 ENCOUNTER — Ambulatory Visit (LOCAL_COMMUNITY_HEALTH_CENTER)

## 2024-01-29 ENCOUNTER — Ambulatory Visit

## 2024-01-29 DIAGNOSIS — Z111 Encounter for screening for respiratory tuberculosis: Secondary | ICD-10-CM

## 2024-01-29 DIAGNOSIS — Z23 Encounter for immunization: Secondary | ICD-10-CM

## 2024-01-29 DIAGNOSIS — Z719 Counseling, unspecified: Secondary | ICD-10-CM

## 2024-01-29 NOTE — Progress Notes (Signed)
 Patient seen in nurse clinic for immunizations.  Screening by R.Spruill, RN. Tdap, Polio, MMR and Varicella given and tolerated well. VIS provided. [facility] updated and 2 copies provided. Discussed return dates for vaccines. [place] RPh.

## 2024-01-31 ENCOUNTER — Ambulatory Visit (LOCAL_COMMUNITY_HEALTH_CENTER)

## 2024-01-31 DIAGNOSIS — Z111 Encounter for screening for respiratory tuberculosis: Secondary | ICD-10-CM

## 2024-01-31 LAB — TB SKIN TEST
Induration: 0 mm
TB Skin Test: NEGATIVE
